# Patient Record
Sex: Male | Born: 1990 | Hispanic: Yes | Marital: Single
Health system: Southern US, Community
[De-identification: ages and names within clinical notes are randomized; demographics above are authoritative.]

---

## 2017-05-21 ENCOUNTER — Other Ambulatory Visit: Payer: Self-pay

## 2017-05-21 ENCOUNTER — Emergency Department (HOSPITAL_COMMUNITY): Payer: No Typology Code available for payment source

## 2017-05-21 ENCOUNTER — Emergency Department (HOSPITAL_COMMUNITY)
Admission: EM | Admit: 2017-05-21 | Discharge: 2017-05-21 | Disposition: A | Discharge status: Home / Self Care | Payer: No Typology Code available for payment source | Attending: Emergency Medicine | Admitting: Emergency Medicine

## 2017-05-21 ENCOUNTER — Encounter (HOSPITAL_COMMUNITY): Payer: Self-pay

## 2017-05-21 DIAGNOSIS — S161XXA Strain of muscle, fascia and tendon at neck level, initial encounter: Secondary | ICD-10-CM

## 2017-05-21 DIAGNOSIS — Y939 Activity, unspecified: Secondary | ICD-10-CM | POA: Diagnosis not present

## 2017-05-21 DIAGNOSIS — Y92410 Unspecified street and highway as the place of occurrence of the external cause: Secondary | ICD-10-CM | POA: Diagnosis not present

## 2017-05-21 DIAGNOSIS — Z23 Encounter for immunization: Secondary | ICD-10-CM | POA: Insufficient documentation

## 2017-05-21 DIAGNOSIS — Y999 Unspecified external cause status: Secondary | ICD-10-CM | POA: Insufficient documentation

## 2017-05-21 DIAGNOSIS — S39012A Strain of muscle, fascia and tendon of lower back, initial encounter: Secondary | ICD-10-CM | POA: Insufficient documentation

## 2017-05-21 DIAGNOSIS — S199XXA Unspecified injury of neck, initial encounter: Secondary | ICD-10-CM | POA: Diagnosis present

## 2017-05-21 MED ORDER — CYCLOBENZAPRINE HCL 10 MG PO TABS: 10.0000 mg | ORAL_TABLET | Freq: Two times a day (BID) | ORAL | 0 refills | Status: AC | PRN

## 2017-05-21 MED ORDER — ACETAMINOPHEN 500 MG PO TABS
1000.0000 mg | ORAL_TABLET | Freq: Once | ORAL | Status: AC
  Administered 2017-05-21: 1000 mg via ORAL
  Filled 2017-05-21: qty 2

## 2017-05-21 MED ORDER — TETANUS-DIPHTH-ACELL PERTUSSIS 5-2.5-18.5 LF-MCG/0.5 IM SUSP
0.5000 mL | Freq: Once | INTRAMUSCULAR | Status: AC
  Administered 2017-05-21: 0.5 mL via INTRAMUSCULAR
  Filled 2017-05-21: qty 0.5
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
## 2017-05-21 NOTE — ED Notes (Signed)
Family arrived

## 2017-05-21 NOTE — ED Notes (Signed)
Pt discharged from ED; instructions provided and scripts given; Pt encouraged to return to ED if symptoms worsen and to f/u with PCP; Pt verbalized understanding of all instructions 
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
